# Patient Record
Sex: Male | Born: 1985 | Race: White | Hispanic: No | Marital: Single
Health system: Midwestern US, Community
[De-identification: ages and names within clinical notes are randomized; demographics above are authoritative.]

## PROBLEM LIST (undated history)

## (undated) DIAGNOSIS — N200 Calculus of kidney: Secondary | ICD-10-CM

## (undated) DIAGNOSIS — N19 Unspecified kidney failure: Secondary | ICD-10-CM

---

## 2011-02-18 ENCOUNTER — Emergency Department (HOSPITAL_COMMUNITY)
Admission: EM | Admit: 2011-02-18 | Discharge: 2011-02-18 | Disposition: A | Discharge status: Home / Self Care | Payer: No Typology Code available for payment source | Attending: Emergency Medicine | Admitting: Emergency Medicine

## 2011-02-18 ENCOUNTER — Emergency Department (HOSPITAL_COMMUNITY): Payer: No Typology Code available for payment source

## 2011-02-18 DIAGNOSIS — M542 Cervicalgia: Secondary | ICD-10-CM | POA: Insufficient documentation

## 2011-02-18 DIAGNOSIS — S139XXA Sprain of joints and ligaments of unspecified parts of neck, initial encounter: Secondary | ICD-10-CM | POA: Insufficient documentation

## 2014-04-17 ENCOUNTER — Emergency Department (HOSPITAL_COMMUNITY)
Admission: EM | Admit: 2014-04-17 | Discharge: 2014-04-17 | Disposition: A | Discharge status: Home / Self Care | Payer: BC Managed Care – PPO | Attending: Emergency Medicine | Admitting: Emergency Medicine

## 2014-04-17 ENCOUNTER — Encounter (HOSPITAL_COMMUNITY): Payer: Self-pay | Admitting: Emergency Medicine

## 2014-04-17 ENCOUNTER — Emergency Department (HOSPITAL_COMMUNITY): Payer: BC Managed Care – PPO

## 2014-04-17 DIAGNOSIS — F17200 Nicotine dependence, unspecified, uncomplicated: Secondary | ICD-10-CM | POA: Insufficient documentation

## 2014-04-17 DIAGNOSIS — Z79899 Other long term (current) drug therapy: Secondary | ICD-10-CM | POA: Insufficient documentation

## 2014-04-17 DIAGNOSIS — Z87442 Personal history of urinary calculi: Secondary | ICD-10-CM | POA: Insufficient documentation

## 2014-04-17 DIAGNOSIS — N23 Unspecified renal colic: Secondary | ICD-10-CM

## 2014-04-17 HISTORY — DX: Calculus of kidney: N20.0

## 2014-04-17 HISTORY — DX: Unspecified kidney failure: N19

## 2014-04-17 LAB — CBC WITH DIFFERENTIAL/PLATELET
BASOS PCT: 1 % (ref 0–1)
Basophils Absolute: 0 10*3/uL (ref 0.0–0.1)
EOS ABS: 0.1 10*3/uL (ref 0.0–0.7)
EOS PCT: 1 % (ref 0–5)
HEMATOCRIT: 44 % (ref 39.0–52.0)
HEMOGLOBIN: 15.6 g/dL (ref 13.0–17.0)
LYMPHS ABS: 3 10*3/uL (ref 0.7–4.0)
Lymphocytes Relative: 41 % (ref 12–46)
MCH: 31.5 pg (ref 26.0–34.0)
MCHC: 35.5 g/dL (ref 30.0–36.0)
MCV: 88.7 fL (ref 78.0–100.0)
MONO ABS: 0.7 10*3/uL (ref 0.1–1.0)
MONOS PCT: 9 % (ref 3–12)
Neutro Abs: 3.4 10*3/uL (ref 1.7–7.7)
Neutrophils Relative %: 48 % (ref 43–77)
Platelets: 283 10*3/uL (ref 150–400)
RBC: 4.96 MIL/uL (ref 4.22–5.81)
RDW: 11.8 % (ref 11.5–15.5)
WBC: 7.2 10*3/uL (ref 4.0–10.5)

## 2014-04-17 LAB — COMPREHENSIVE METABOLIC PANEL
ALBUMIN: 4.1 g/dL (ref 3.5–5.2)
ALK PHOS: 59 U/L (ref 39–117)
ALT: 16 U/L (ref 0–53)
AST: 16 U/L (ref 0–37)
BILIRUBIN TOTAL: 1.2 mg/dL (ref 0.3–1.2)
BUN: 12 mg/dL (ref 6–23)
CHLORIDE: 105 meq/L (ref 96–112)
CO2: 22 mEq/L (ref 19–32)
Calcium: 9 mg/dL (ref 8.4–10.5)
Creatinine, Ser: 1.07 mg/dL (ref 0.50–1.35)
GFR calc Af Amer: >90 mL/min (ref >90)
GFR calc non Af Amer: >90 mL/min (ref >90)
Glucose, Bld: 109 mg/dL — ABNORMAL HIGH (ref 70–99)
POTASSIUM: 3.8 meq/L (ref 3.7–5.3)
Sodium: 143 mEq/L (ref 137–147)
Total Protein: 6.7 g/dL (ref 6.0–8.3)

## 2014-04-17 LAB — URINALYSIS, ROUTINE W REFLEX MICROSCOPIC
GLUCOSE, UA: NEGATIVE mg/dL
KETONES UR: NEGATIVE mg/dL
NITRITE: NEGATIVE
PH: 5.5 (ref 5.0–8.0)
Protein, ur: 100 mg/dL — AB
Specific Gravity, Urine: 1.029 (ref 1.005–1.030)
Urobilinogen, UA: 1 mg/dL (ref 0.0–1.0)

## 2014-04-17 LAB — URINE MICROSCOPIC-ADD ON

## 2014-04-17 LAB — LIPASE, BLOOD: LIPASE: 19 U/L (ref 11–59)

## 2014-04-17 MED ORDER — HYDROMORPHONE HCL PF 1 MG/ML IJ SOLN
1.0000 mg | Freq: Once | INTRAMUSCULAR | Status: AC
  Administered 2014-04-17: 1 mg via INTRAVENOUS
  Filled 2014-04-17: qty 1

## 2014-04-17 MED ORDER — HYDROMORPHONE HCL 4 MG PO TABS: 4.0000 mg | ORAL_TABLET | ORAL | Status: DC | PRN

## 2014-04-17 MED ORDER — METOCLOPRAMIDE HCL 10 MG PO TABS: 10.0000 mg | ORAL_TABLET | Freq: Four times a day (QID) | ORAL | Status: AC | PRN

## 2014-04-17 MED ORDER — ONDANSETRON 8 MG PO TBDP: ORAL_TABLET | ORAL | Status: DC

## 2014-04-17 MED ORDER — HYDROMORPHONE HCL PF 1 MG/ML IJ SOLN: 1.0000 mg | Freq: Once | INTRAMUSCULAR | Status: DC

## 2014-04-17 MED ORDER — CEPHALEXIN 500 MG PO CAPS: ORAL_CAPSULE | ORAL | Status: DC

## 2014-04-17 MED ORDER — METOCLOPRAMIDE HCL 5 MG/ML IJ SOLN
10.0000 mg | Freq: Once | INTRAMUSCULAR | Status: AC
  Administered 2014-04-17: 10 mg via INTRAVENOUS
  Filled 2014-04-17: qty 2

## 2014-04-17 NOTE — Progress Notes (Signed)
[facility] provided pt with a list of primary care resources to help patient establish a pcp.  °

## 2014-04-17 NOTE — ED Notes (Signed)
Patient transported to Ultrasound 

## 2014-04-17 NOTE — ED Notes (Signed)
Rn went to d/c patient, pt requesting more pain medication before leaving. MD made aware and 1mg  of Dilaudid given. Will wait 15-20 minutes before discahrging pt. After medication given.

## 2014-04-17 NOTE — ED Notes (Signed)
Pt complains of L flank pain that began this am. Pt states it feels like kidney stone pain/pain from kidney failure. Pt was treated for kidney failur in September, but recovered. EMS gave 30mg  tordol, 4mg  zofran and 400ml NS. Pt states pain has decreased to 6/10 from 10/10.

## 2014-04-17 NOTE — ED Provider Notes (Signed)
CSN: [id number]     Arrival date & time 04/17/14  1105 History   First MD Initiated Contact with Patient 04/17/14 1113     Chief Complaint  Patient presents with  . Flank Pain     (Consider location/radiation/quality/duration/timing/severity/associated sxs/prior Treatment) HPI 28 year old male with history kidney stones also history of kidney failure on related to stones last CT scan within the last year and other healthcare system states his typical sudden severe colicky left flank pain with multiple episodes of nonbloody vomiting this morning with pain and vomiting not controlled with Toradol and Zofran from EMS. He is no fever no confusion no chest pain no shortness of breath no right-sided abdominal pain no other concerns. His left flank pain radiates to his left side of his abdomen. Past Medical History  Diagnosis Date  . Kidney stone   . Kidney failure    History reviewed. No pertinent past surgical history. History reviewed. No pertinent family history. History  Substance Use Topics  . Smoking status: Current Some Day Smoker  . Smokeless tobacco: Not on file  . Alcohol Use: Yes    Review of Systems 10 Systems reviewed and are negative for acute change except as noted in the HPI.   Allergies  Review of patient's allergies indicates no known allergies.  Home Medications   Prior to Admission medications   Medication Sig Start Date End Date Taking? Authorizing Provider  loratadine (CLARITIN) 10 MG tablet Take 10 mg by mouth daily.   Yes Historical Provider, MD  cephALEXin (KEFLEX) 500 MG capsule 2 caps po bid x 7 days 04/17/14   Hurman HornJohn M Bednar, MD  HYDROmorphone (DILAUDID) 4 MG tablet Take 1 tablet (4 mg total) by mouth every 4 (four) hours as needed for severe pain. 04/17/14   Hurman HornJohn M Bednar, MD  metoCLOPramide (REGLAN) 10 MG tablet Take 1 tablet (10 mg total) by mouth every 6 (six) hours as needed for nausea (nausea/headache). 04/17/14   Hurman HornJohn M Bednar, MD  ondansetron (ZOFRAN  ODT) 8 MG disintegrating tablet 8mg  ODT q4 hours prn nausea 04/17/14   Hurman HornJohn M Bednar, MD   BP 115/64  Pulse 62  Temp(Src) 97.6 F (36.4 C) (Oral)  Resp 16  SpO2 96% Physical Exam  Nursing note and vitals reviewed. Constitutional:  Awake, alert, nontoxic appearance.  HENT:  Head: Atraumatic.  Eyes: Right eye exhibits no discharge. Left eye exhibits no discharge.  Neck: Neck supple.  Cardiovascular: Normal rate and regular rhythm.   No murmur heard. Pulmonary/Chest: Effort normal and breath sounds normal. No respiratory distress. He has no wheezes. He has no rales. He exhibits no tenderness.  Abdominal: Soft. Bowel sounds are normal. He exhibits no distension and no mass. There is tenderness. There is no rebound and no guarding.  Mild left-sided abdominal tenderness  Genitourinary:  Positive left CVA tenderness; negative for right CVA tenderness  Musculoskeletal: He exhibits no tenderness.  Baseline ROM, no obvious new focal weakness.  Neurological: He is alert.  Mental status and motor strength appears baseline for patient and situation.  Skin: No rash noted.  Psychiatric: He has a normal mood and affect.    ED Course  Procedures (including critical care time) Pt feels improved after observation and/or treatment in ED.Patient / Family / Caregiver informed of clinical course, understand medical decision-making process, and agree with plan. Labs Review Labs Reviewed  COMPREHENSIVE METABOLIC PANEL - Abnormal; Notable for the following:    Glucose, Bld 109 (*)    All other  components within normal limits  URINALYSIS, ROUTINE W REFLEX MICROSCOPIC - Abnormal; Notable for the following:    Color, Urine RED (*)    APPearance TURBID (*)    Hgb urine dipstick LARGE (*)    Bilirubin Urine MODERATE (*)    Protein, ur 100 (*)    Leukocytes, UA SMALL (*)    All other components within normal limits  URINE MICROSCOPIC-ADD ON - Abnormal; Notable for the following:    Bacteria, UA FEW  (*)    Crystals CA OXALATE CRYSTALS (*)    All other components within normal limits  URINE CULTURE  LIPASE, BLOOD  CBC WITH DIFFERENTIAL    Imaging Review No results found. [facility]  04/17/2014   CLINICAL DATA:  Flank pain.  EXAM: RENAL/URINARY TRACT ULTRASOUND COMPLETE  COMPARISON:  None.  FINDINGS: Right Kidney:  Length: 10.5 cm. Echogenicity within normal limits. No mass or hydronephrosis visualized. There may be a 0.5 cm in diameter nonobstructing stone in the upper pole.  Left Kidney:  Length: 11.5 cm. Echogenicity within normal limits. No mass or hydronephrosis visualized. There appears to be a 1.5 cm nonobstructing stone lower pole.  Bladder:  Appears normal for degree of bladder distention. The prostate gland appears prominent with a calcification identified.  IMPRESSION: Negative for hydronephrosis or other acute abnormality.  Possible nonobstructing bilateral renal stones, larger on the left.   Electronically Signed   By: Drusilla Kannerhomas  Dalessio M.D.   On: 04/17/2014 12:33   EKG Interpretation None      MDM   Final diagnoses:  Renal colic on left side    I doubt any other EMC precluding discharge at this time including, but not necessarily limited to the following:ARF, sepsis.    Hurman HornJohn M Bednar, MD 04/19/14 2153

## 2014-04-17 NOTE — ED Notes (Signed)
MD at bedside. 

## 2014-04-17 NOTE — ED Notes (Signed)
Pt returned from US

## 2014-04-17 NOTE — ED Notes (Signed)
Pt. Is unable to use the restroom at this time, but is aware that we need a urine specimen.  

## 2014-04-17 NOTE — Discharge Instructions (Signed)

## 2014-04-17 NOTE — ED Notes (Signed)
Bed: WA17 Expected date:  Expected time:  Means of arrival:  Comments: ems- flank pain 

## 2014-04-18 LAB — URINE CULTURE
CULTURE: NO GROWTH
Colony Count: NO GROWTH

## 2014-05-03 ENCOUNTER — Encounter (HOSPITAL_COMMUNITY): Payer: Self-pay | Admitting: Emergency Medicine

## 2014-05-03 ENCOUNTER — Emergency Department (HOSPITAL_COMMUNITY)
Admission: EM | Admit: 2014-05-03 | Discharge: 2014-05-03 | Disposition: A | Discharge status: Home / Self Care | Payer: No Typology Code available for payment source | Attending: Emergency Medicine | Admitting: Emergency Medicine

## 2014-05-03 DIAGNOSIS — N19 Unspecified kidney failure: Secondary | ICD-10-CM | POA: Insufficient documentation

## 2014-05-03 DIAGNOSIS — Z880 Allergy status to penicillin: Secondary | ICD-10-CM | POA: Insufficient documentation

## 2014-05-03 DIAGNOSIS — F17200 Nicotine dependence, unspecified, uncomplicated: Secondary | ICD-10-CM | POA: Insufficient documentation

## 2014-05-03 DIAGNOSIS — N23 Unspecified renal colic: Secondary | ICD-10-CM

## 2014-05-03 DIAGNOSIS — Z79899 Other long term (current) drug therapy: Secondary | ICD-10-CM | POA: Insufficient documentation

## 2014-05-03 LAB — URINE MICROSCOPIC-ADD ON

## 2014-05-03 LAB — CBC WITH DIFFERENTIAL/PLATELET
Basophils Absolute: 0 10*3/uL (ref 0.0–0.1)
Basophils Relative: 0 % (ref 0–1)
EOS ABS: 0 10*3/uL (ref 0.0–0.7)
EOS PCT: 0 % (ref 0–5)
HEMATOCRIT: 46.2 % (ref 39.0–52.0)
Hemoglobin: 16.5 g/dL (ref 13.0–17.0)
LYMPHS ABS: 2.2 10*3/uL (ref 0.7–4.0)
Lymphocytes Relative: 19 % (ref 12–46)
MCH: 31.7 pg (ref 26.0–34.0)
MCHC: 35.7 g/dL (ref 30.0–36.0)
MCV: 88.8 fL (ref 78.0–100.0)
MONO ABS: 0.9 10*3/uL (ref 0.1–1.0)
Monocytes Relative: 8 % (ref 3–12)
Neutro Abs: 8.5 10*3/uL — ABNORMAL HIGH (ref 1.7–7.7)
Neutrophils Relative %: 73 % (ref 43–77)
PLATELETS: 315 10*3/uL (ref 150–400)
RBC: 5.2 MIL/uL (ref 4.22–5.81)
RDW: 11.6 % (ref 11.5–15.5)
WBC: 11.6 10*3/uL — ABNORMAL HIGH (ref 4.0–10.5)

## 2014-05-03 LAB — BASIC METABOLIC PANEL
Anion gap: 17 — ABNORMAL HIGH (ref 5–15)
BUN: 16 mg/dL (ref 6–23)
CALCIUM: 10.2 mg/dL (ref 8.4–10.5)
CO2: 22 meq/L (ref 19–32)
CREATININE: 1.1 mg/dL (ref 0.50–1.35)
Chloride: 102 mEq/L (ref 96–112)
GFR calc Af Amer: >90 mL/min (ref >90)
GLUCOSE: 117 mg/dL — AB (ref 70–99)
Potassium: 3.7 mEq/L (ref 3.7–5.3)
SODIUM: 141 meq/L (ref 137–147)

## 2014-05-03 LAB — URINALYSIS, ROUTINE W REFLEX MICROSCOPIC
Bilirubin Urine: NEGATIVE
Glucose, UA: NEGATIVE mg/dL
Ketones, ur: NEGATIVE mg/dL
LEUKOCYTES UA: NEGATIVE
NITRITE: NEGATIVE
PROTEIN: NEGATIVE mg/dL
SPECIFIC GRAVITY, URINE: 1.02 (ref 1.005–1.030)
UROBILINOGEN UA: 0.2 mg/dL (ref 0.0–1.0)
pH: 5.5 (ref 5.0–8.0)

## 2014-05-03 MED ORDER — ONDANSETRON HCL 4 MG/2ML IJ SOLN
4.0000 mg | Freq: Once | INTRAMUSCULAR | Status: AC
  Administered 2014-05-03: 4 mg via INTRAVENOUS
  Filled 2014-05-03: qty 2

## 2014-05-03 MED ORDER — HYDROMORPHONE HCL PF 1 MG/ML IJ SOLN
2.0000 mg | Freq: Once | INTRAMUSCULAR | Status: AC
  Administered 2014-05-03: 2 mg via INTRAVENOUS
  Filled 2014-05-03: qty 2

## 2014-05-03 MED ORDER — ONDANSETRON 8 MG PO TBDP: ORAL_TABLET | ORAL | Status: AC

## 2014-05-03 MED ORDER — SODIUM CHLORIDE 0.9 % IV SOLN
INTRAVENOUS | Status: DC
  Administered 2014-05-03: 12:00:00 via INTRAVENOUS

## 2014-05-03 NOTE — ED Notes (Signed)
MD at bedside. 

## 2014-05-03 NOTE — ED Provider Notes (Addendum)
CSN: [id number]     Arrival date & time 05/03/14  1036 History   First MD Initiated Contact with Patient 05/03/14 1054     Chief Complaint  Patient presents with  . Nephrolithiasis    left     (Consider location/radiation/quality/duration/timing/severity/associated sxs/prior Treatment) The history is provided by the patient.   patient here complaining of worsening renal colic. Seen by urologist last week for a history of multiple stones. Conservative therapy is currently being used. He continues to note increasing left flank pain with urinary urgency. Has been vomiting at home and cannot keep down his oral hydromorphone tablets. No fever or chills. Symptoms are made better with nothing and he can't stop is comfortable. Denies any testicular swelling. No penile drainage or discharge. No rashes to his flank.  Past Medical History  Diagnosis Date  . Kidney stone   . Kidney failure    History reviewed. No pertinent past surgical history. No family history on file. History  Substance Use Topics  . Smoking status: Current Some Day Smoker  . Smokeless tobacco: Not on file  . Alcohol Use: Yes    Review of Systems  All other systems reviewed and are negative.     Allergies  Penicillins  Home Medications   Prior to Admission medications   Medication Sig Start Date End Date Taking? Authorizing Provider  HYDROmorphone (DILAUDID) 2 MG tablet Take 4 mg by mouth every 4 (four) hours as needed for severe pain.   Yes Historical Provider, MD  metoCLOPramide (REGLAN) 10 MG tablet Take 1 tablet (10 mg total) by mouth every 6 (six) hours as needed for nausea (nausea/headache). 04/17/14   Hurman HornJohn M Bednar, MD  ondansetron (ZOFRAN ODT) 8 MG disintegrating tablet 8mg  ODT q4 hours prn nausea 04/17/14   Hurman HornJohn M Bednar, MD   BP 122/79  Pulse 73  Resp 20  SpO2 99% Physical Exam  Nursing note and vitals reviewed. Constitutional: He is oriented to person, place, and time. He appears well-developed and  well-nourished.  Non-toxic appearance. No distress.  HENT:  Head: Normocephalic and atraumatic.  Eyes: Conjunctivae, EOM and lids are normal. Pupils are equal, round, and reactive to light.  Neck: Normal range of motion. Neck supple. No tracheal deviation present. No mass present.  Cardiovascular: Normal rate, regular rhythm and normal heart sounds.  Exam reveals no gallop.   No murmur heard. Pulmonary/Chest: Effort normal and breath sounds normal. No stridor. No respiratory distress. He has no decreased breath sounds. He has no wheezes. He has no rhonchi. He has no rales.  Abdominal: Soft. Normal appearance and bowel sounds are normal. He exhibits no distension. There is tenderness in the left lower quadrant. There is CVA tenderness. There is no rigidity, no rebound and no guarding.    Musculoskeletal: Normal range of motion. He exhibits no edema and no tenderness.  Neurological: He is alert and oriented to person, place, and time. He has normal strength. No cranial nerve deficit or sensory deficit. GCS eye subscore is 4. GCS verbal subscore is 5. GCS motor subscore is 6.  Skin: Skin is warm and dry. No abrasion and no rash noted.  Psychiatric: He has a normal mood and affect. His speech is normal and behavior is normal.    ED Course  Procedures (including critical care time) Labs Review Labs Reviewed  URINE CULTURE  CBC WITH DIFFERENTIAL  BASIC METABOLIC PANEL  URINALYSIS, ROUTINE W REFLEX MICROSCOPIC    Imaging Review No results found.   EKG  Interpretation None      MDM   Final diagnoses:  None    Patient has had his pain controlled here. Spoke with his urologist who agrees to discharge and will see him in followup. Recommends no CT at this time    Toy Baker, MD 05/03/14 1340  Toy Baker, MD 05/03/14 [phone]

## 2014-05-03 NOTE — ED Notes (Signed)
Pt states was seen last week for kidney stones, saw the urologist and was waiting to see if would pass, states stones on L side, states still in severe pain and having n/v, pt actively vomiting in triage, states took 2mg  PO dilaudid at home w/ no relief.

## 2014-05-03 NOTE — Discharge Instructions (Signed)

## 2014-05-04 LAB — URINE CULTURE
Colony Count: NO GROWTH
Culture: NO GROWTH

## 2015-11-26 IMAGING — US US RENAL
1 series · 14 of 25 positions shown · non-contrast
Comparison: None.

CLINICAL DATA: Flank pain.

EXAM:
RENAL/URINARY TRACT ULTRASOUND COMPLETE

[Series 1: us renal · 0.20mm/px · 14 of 55 slices shown]
[im 1/55]
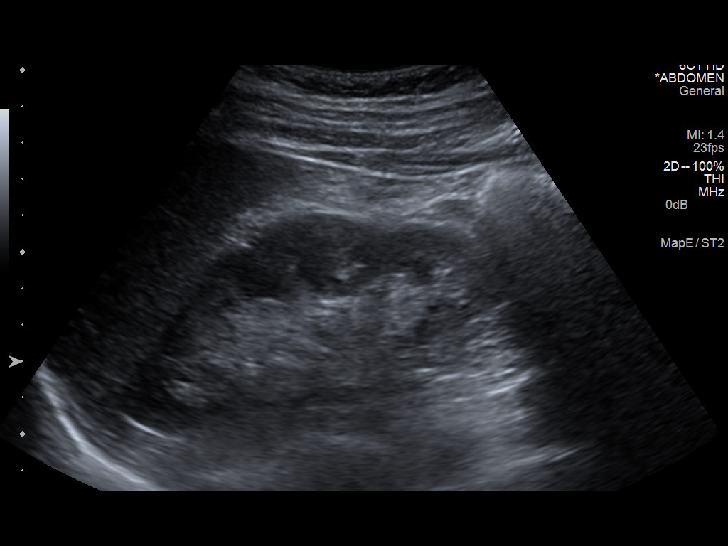
[im 5/55]
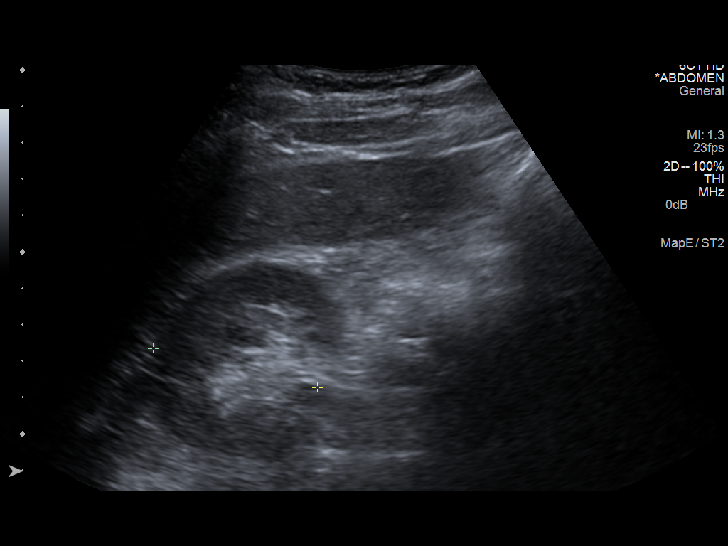
[im 10/55]
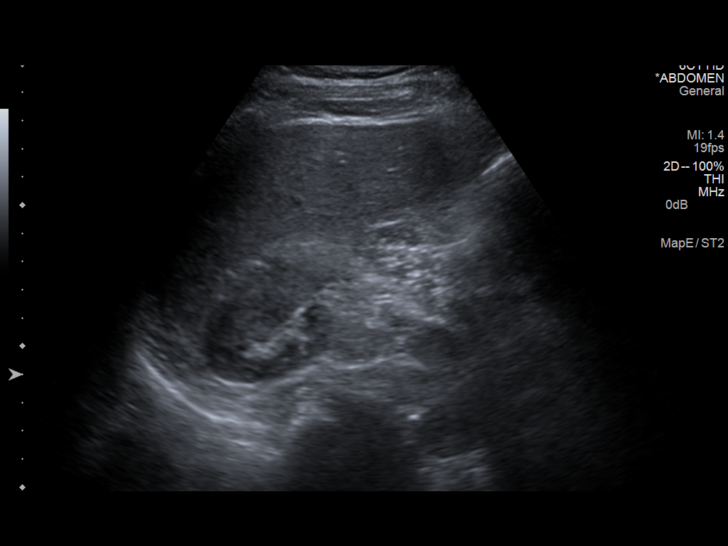
[im 14/55]
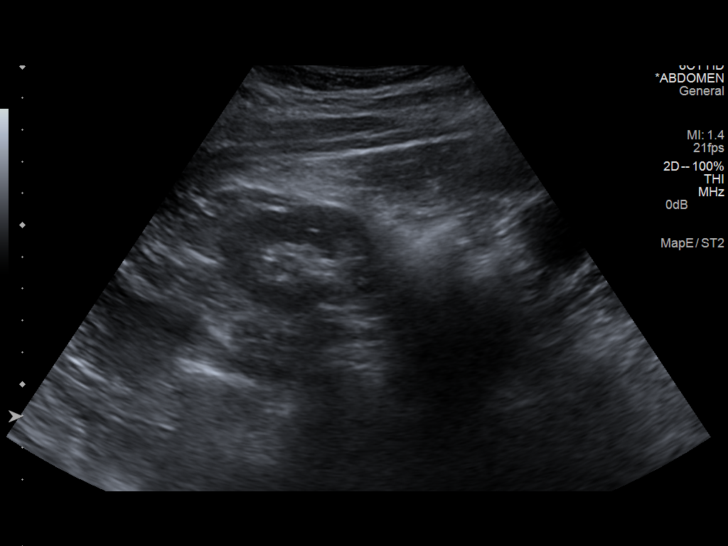
[im 19/55]
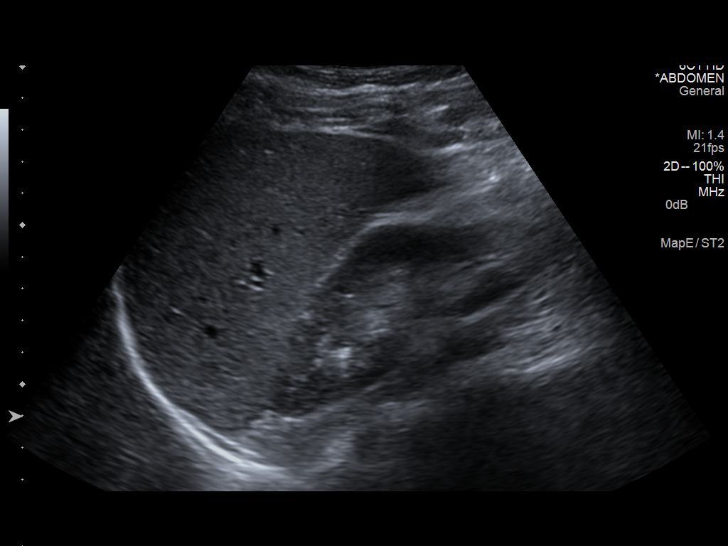
[im 21/55]
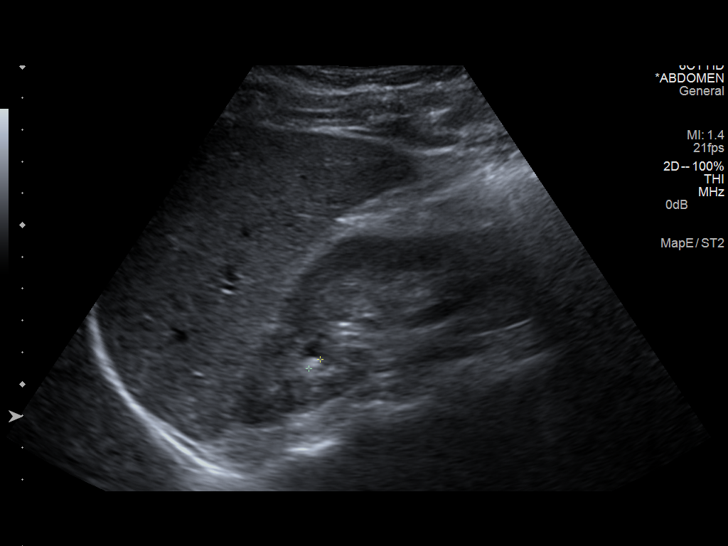
[im 25/55]
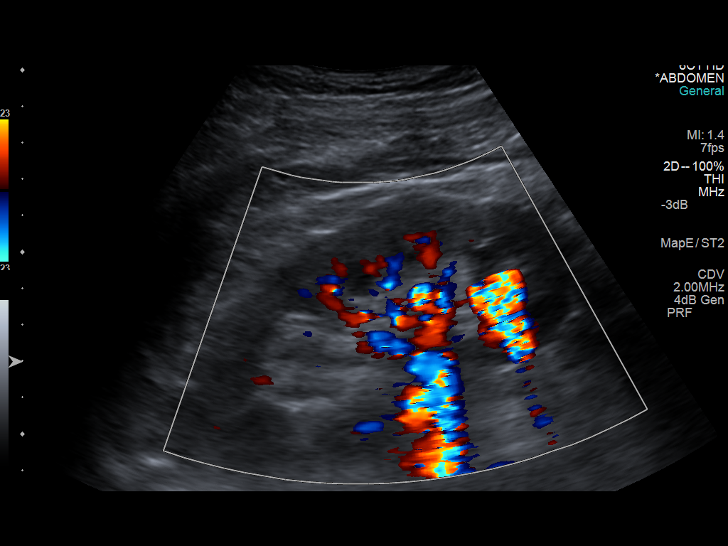
[im 30/55]
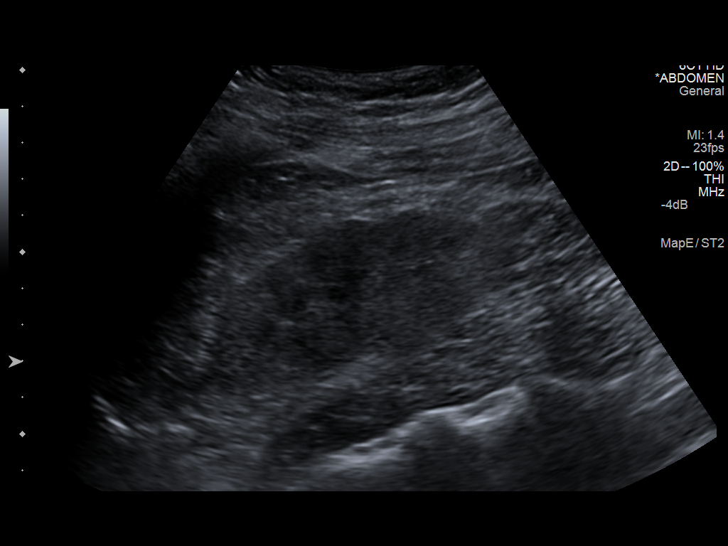
[im 34/55]
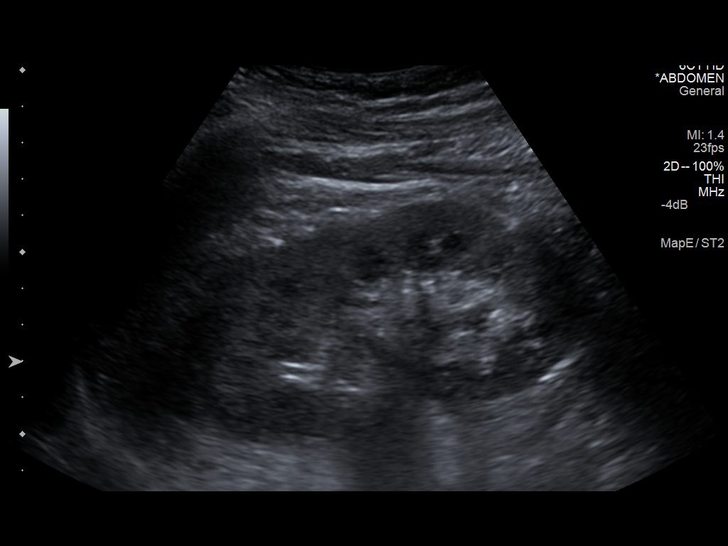
[im 37/55]
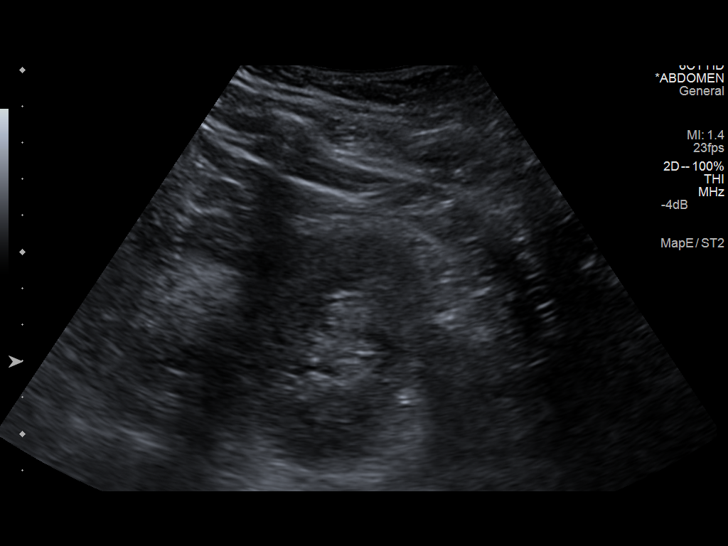
[im 41/55]
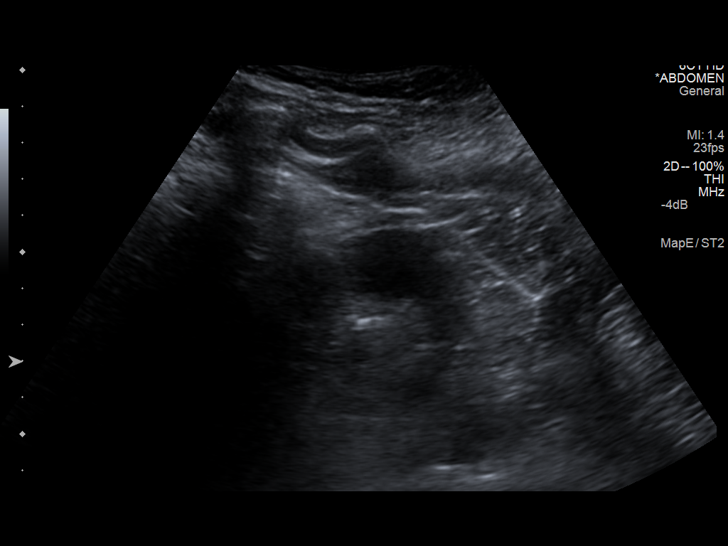
[im 46/55]
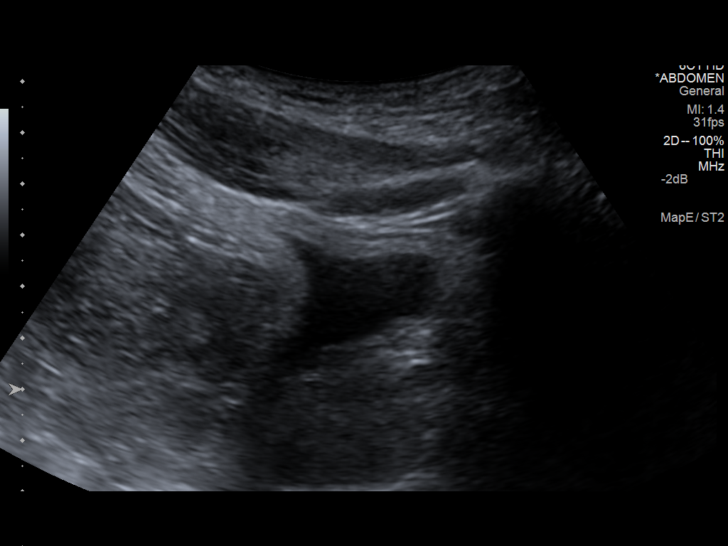
[im 50/55]
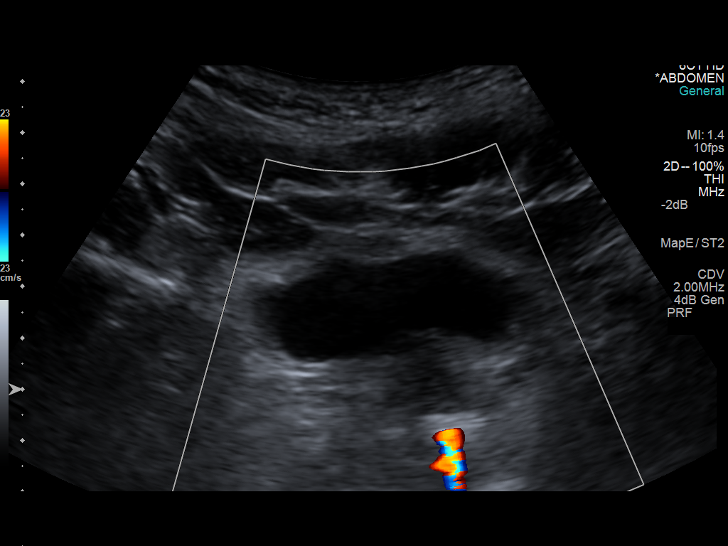
[im 55/55]
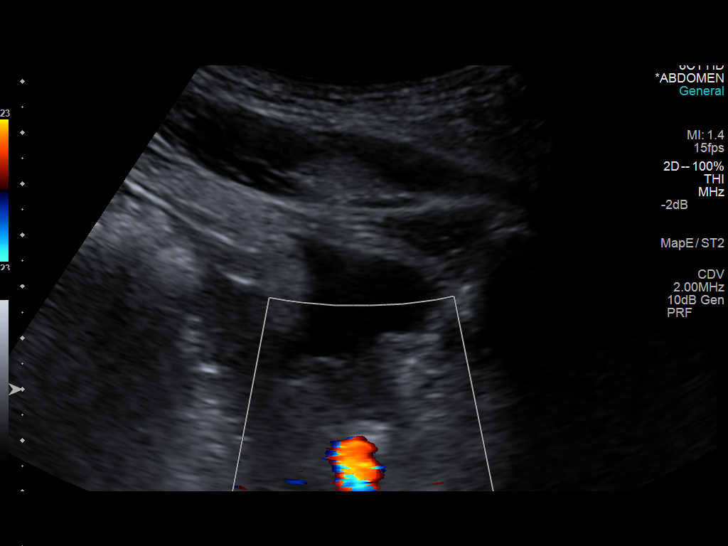

[14 of 25 positions shown; findings below may reference images not displayed]

FINDINGS: Right Kidney:

Length: 10.5 cm. Echogenicity within normal limits. No mass or
hydronephrosis visualized. There may be a 0.5 cm in diameter
nonobstructing stone in the upper pole.

Left Kidney:

Length: 11.5 cm. Echogenicity within normal limits. No mass or
hydronephrosis visualized. There appears to be a 1.5 cm
nonobstructing stone lower pole.

Bladder:

Appears normal for degree of bladder distention. The prostate gland
appears prominent with a calcification identified.
IMPRESSION: Negative for hydronephrosis or other acute abnormality.

Possible nonobstructing bilateral renal stones, larger on the left.

## 2017-09-22 NOTE — Progress Notes (Signed)
Formatting of this note is different from the original.  Subjective     Patient ID: Roy Garcia is a 31 y.o. male.    Urinary Tract Infection    Chronicity: Pts gf has UTI from Ureaplasma,pt has no symptoms. The patient is experiencing no pain. There has been no fever. Pertinent negatives include no chills, discharge, flank pain, frequency, hematuria, nausea, urgency or vomiting. He has tried nothing for the symptoms.     The following portions of the patient's history were reviewed and updated as appropriate: allergies, current medications, past family history, past medical history, past social history, past surgical history, problem list.    Vitals:    09/22/17 1856   BP: 116/78   Pulse: 78   Resp: 16   Temp: 98.6 F (37 C)   TempSrc: Oral   SpO2: 99%     Review of Systems   Constitutional: Negative for chills and fever.   HENT: Negative for congestion, ear pain, rhinorrhea, sinus pressure and sore throat.    Respiratory: Negative for cough and shortness of breath.    Cardiovascular: Negative for chest pain.   Gastrointestinal: Negative for abdominal pain, diarrhea, nausea and vomiting.   Genitourinary: Negative for difficulty urinating, discharge, dysuria, flank pain, frequency, hematuria and urgency.   Musculoskeletal: Negative for myalgias.   Skin: Negative for rash.   All other systems reviewed and are negative.    Objective     Physical Exam   Constitutional: He is oriented to person, place, and time. He appears well-developed and well-nourished.   Cardiovascular: Normal rate, regular rhythm and normal heart sounds.   Pulmonary/Chest: Effort normal and breath sounds normal.   Abdominal: Soft. Bowel sounds are normal. There is no tenderness.   Neurological: He is alert and oriented to person, place, and time.   Psychiatric: He has a normal mood and affect.   Nursing note and vitals reviewed.    Procedures    Assessment/Plan     Diagnoses and all orders for this visit:    Urinary tract infection without  hematuria, site unspecified    Roy Garcia discharge to Home    Condition at discharge: Good  Electronically signed by Albertha Ghee, MD at 09/22/2017  7:21 PM EST

## 2017-09-22 NOTE — Addendum Note (Signed)
Addended by: Jennet Maduro on: 09/22/2017 07:33 PM     Modules accepted: Orders      Electronically signed by Jennet Maduro, MA at 09/22/2017  7:33 PM EST

## 2023-01-07 ENCOUNTER — Inpatient Hospital Stay
Admit: 2023-01-07 | Discharge: 2023-01-07 | Disposition: A | Discharge status: Home / Self Care | Payer: PRIVATE HEALTH INSURANCE | Attending: Student in an Organized Health Care Education/Training Program

## 2023-01-07 ENCOUNTER — Emergency Department: Admit: 2023-01-07 | Payer: PRIVATE HEALTH INSURANCE

## 2023-01-07 DIAGNOSIS — N132 Hydronephrosis with renal and ureteral calculous obstruction: Secondary | ICD-10-CM

## 2023-01-07 DIAGNOSIS — N200 Calculus of kidney: Secondary | ICD-10-CM

## 2023-01-07 LAB — CBC WITH AUTO DIFFERENTIAL
Absolute Immature Granulocyte: 0 10*3/uL (ref 0.00–0.04)
Basophils %: 1 % (ref 0–1)
Basophils Absolute: 0.1 10*3/uL (ref 0.0–0.1)
Eosinophils %: 1 % (ref 0–7)
Eosinophils Absolute: 0 10*3/uL (ref 0.0–0.4)
Hematocrit: 48 % (ref 36.6–50.3)
Hemoglobin: 16.6 g/dL (ref 12.1–17.0)
Immature Granulocytes: 0 % (ref 0.0–0.5)
Lymphocytes %: 35 % (ref 12–49)
Lymphocytes Absolute: 1.9 10*3/uL (ref 0.8–3.5)
MCH: 31.1 PG (ref 26.0–34.0)
MCHC: 34.6 g/dL (ref 30.0–36.5)
MCV: 89.9 FL (ref 80.0–99.0)
MPV: 8.6 FL — ABNORMAL LOW (ref 8.9–12.9)
Monocytes %: 10 % (ref 5–13)
Monocytes Absolute: 0.6 10*3/uL (ref 0.0–1.0)
Neutrophils %: 53 % (ref 32–75)
Neutrophils Absolute: 2.9 10*3/uL (ref 1.8–8.0)
Nucleated RBCs: 0 PER 100 WBC
Platelets: 292 10*3/uL (ref 150–400)
RBC: 5.34 M/uL (ref 4.10–5.70)
RDW: 11.8 % (ref 11.5–14.5)
WBC: 5.4 10*3/uL (ref 4.1–11.1)
nRBC: 0 10*3/uL (ref 0.00–0.01)

## 2023-01-07 LAB — URINALYSIS WITH REFLEX TO CULTURE
BACTERIA, URINE: NEGATIVE /hpf
Bilirubin Urine: NEGATIVE
Glucose, UA: NEGATIVE mg/dL
Ketones, Urine: NEGATIVE mg/dL
Leukocyte Esterase, Urine: NEGATIVE
Nitrite, Urine: NEGATIVE
Specific Gravity, UA: 1.022 (ref 1.003–1.030)
Urobilinogen, Urine: 0.2 EU/dL (ref 0.2–1.0)
pH, Urine: 6 (ref 5.0–8.0)

## 2023-01-07 LAB — EXTRA TUBES HOLD

## 2023-01-07 LAB — COMPREHENSIVE METABOLIC PANEL
ALT: 49 U/L (ref 12–78)
AST: 24 U/L (ref 15–37)
Albumin/Globulin Ratio: 1.2 (ref 1.1–2.2)
Albumin: 4.1 g/dL (ref 3.5–5.0)
Alk Phosphatase: 66 U/L (ref 45–117)
Anion Gap: 4 mmol/L — ABNORMAL LOW (ref 5–15)
BUN: 13 MG/DL (ref 6–20)
Bun/Cre Ratio: 10 — ABNORMAL LOW (ref 12–20)
CO2: 27 mmol/L (ref 21–32)
Calcium: 9.5 MG/DL (ref 8.5–10.1)
Chloride: 109 mmol/L — ABNORMAL HIGH (ref 97–108)
Creatinine: 1.29 MG/DL (ref 0.70–1.30)
Est, Glom Filt Rate: >60 mL/min/{1.73_m2} (ref >60)
Globulin: 3.5 g/dL (ref 2.0–4.0)
Glucose: 107 mg/dL — ABNORMAL HIGH (ref 65–100)
Potassium: 4.2 mmol/L (ref 3.5–5.1)
Sodium: 140 mmol/L (ref 136–145)
Total Bilirubin: 0.5 MG/DL (ref 0.2–1.0)
Total Protein: 7.6 g/dL (ref 6.4–8.2)

## 2023-01-07 LAB — MAGNESIUM: Magnesium: 1.9 mg/dL (ref 1.6–2.4)

## 2023-01-07 LAB — LIPASE: Lipase: 36 U/L (ref 13–75)

## 2023-01-07 MED ORDER — KETOROLAC TROMETHAMINE 30 MG/ML IJ SOLN
30 | Freq: Once | INTRAMUSCULAR | Status: AC
Start: 2023-01-07 — End: 2023-01-07
  Administered 2023-01-07: 14:00:00 15 mg via INTRAVENOUS

## 2023-01-07 MED ORDER — TAMSULOSIN HCL 0.4 MG PO CAPS
0.4 | ORAL_CAPSULE | Freq: Every day | ORAL | 0 refills | Status: AC
Start: 2023-01-07 — End: ?

## 2023-01-07 MED ORDER — ONDANSETRON HCL 4 MG/2ML IJ SOLN
4 | Freq: Once | INTRAMUSCULAR | Status: AC
Start: 2023-01-07 — End: 2023-01-07
  Administered 2023-01-07: 13:00:00 4 mg via INTRAVENOUS

## 2023-01-07 MED ORDER — KETOROLAC TROMETHAMINE 30 MG/ML IJ SOLN
30 | Freq: Once | INTRAMUSCULAR | Status: AC
Start: 2023-01-07 — End: 2023-01-07
  Administered 2023-01-07: 13:00:00 15 mg via INTRAVENOUS

## 2023-01-07 MED ORDER — SODIUM CHLORIDE 0.9 % IV BOLUS
0.9 | Freq: Once | INTRAVENOUS | Status: AC
Start: 2023-01-07 — End: 2023-01-07
  Administered 2023-01-07: 13:00:00 1000 mL via INTRAVENOUS

## 2023-01-07 MED ORDER — OXYCODONE-ACETAMINOPHEN 5-325 MG PO TABS
5-325 | ORAL_TABLET | Freq: Four times a day (QID) | ORAL | 0 refills | Status: AC | PRN
Start: 2023-01-07 — End: 2023-01-10

## 2023-01-07 MED ORDER — OXYCODONE-ACETAMINOPHEN 5-325 MG PO TABS
5-325 | Freq: Once | ORAL | Status: AC
Start: 2023-01-07 — End: 2023-01-07
  Administered 2023-01-07: 16:00:00 1 via ORAL

## 2023-01-07 MED ORDER — ONDANSETRON 4 MG PO TBDP
4 | ORAL_TABLET | Freq: Three times a day (TID) | ORAL | 0 refills | Status: AC | PRN
Start: 2023-01-07 — End: 2023-01-10

## 2023-01-07 MED FILL — KETOROLAC TROMETHAMINE 30 MG/ML IJ SOLN: 30 MG/ML | INTRAMUSCULAR | Qty: 1

## 2023-01-07 MED FILL — ONDANSETRON HCL 4 MG/2ML IJ SOLN: 4 MG/2ML | INTRAMUSCULAR | Qty: 2

## 2023-01-07 MED FILL — SODIUM CHLORIDE 0.9 % IV SOLN: 0.9 % | INTRAVENOUS | Qty: 1000

## 2023-01-07 MED FILL — OXYCODONE-ACETAMINOPHEN 5-325 MG PO TABS: 5-325 MG | ORAL | Qty: 1

## 2023-01-07 NOTE — Discharge Instructions (Addendum)
Discussed visit today.  Please call the kidney stone hotline on your way home today.  Please take the medications as needed for pain and supplement with Tylenol and Motrin.  Please take the Zofran as needed for nausea.  Please take the Flomax daily until told otherwise.    Return to the emergency room with any worsening of symptoms.

## 2023-01-07 NOTE — ED Provider Notes (Signed)
[facility] EMERGENCY [place]      Pt Name: Roy Garcia  MRN: [id number]  [place] 1986-08-05  Date of evaluation: 01/07/2023  Provider: Suezanne Jacquet, PA-C    CHIEF COMPLAINT       Chief Complaint   Patient presents with    Flank Pain         HISTORY OF PRESENT ILLNESS    Patient is a 37 year old male with history of kidney stones who presents emergency room with reports of left flank pain, nausea, and some vomiting since about 4 AM this morning.  Patient reports he did take Tylenol this morning without much relief.  Patient reports his last kidney stone was about 6 years ago.  Patient denies any hematuria, but reports it has been difficult urinating.  Patient reports some radiation of pain into the abdomen area. Patient denies chest pain, shortness of breath, diarrhea or constipation, headache, dizziness, lightheadedness, fever or chills.  Patient denies alcohol use, smoking/vaping or illicit drug use.          Nursing Notes were reviewed.    REVIEW OF SYSTEMS       Review of Systems   Genitourinary:  Positive for difficulty urinating and flank pain.   All other systems reviewed and are negative.        PAST MEDICAL HISTORY   No past medical history on file.      SURGICAL HISTORY     No past surgical history on file.      CURRENT MEDICATIONS       Previous Medications    No medications on file       ALLERGIES     Penicillins    FAMILY HISTORY     No family history on file.       SOCIAL HISTORY       Social History     Socioeconomic History    Marital status: Single       PHYSICAL EXAM       Physical Exam  Vitals reviewed.   Constitutional:       General: He is not in acute distress.     Appearance: Normal appearance. He is normal weight. He is not ill-appearing or toxic-appearing.   HENT:      Head: Normocephalic and atraumatic.      Nose: Nose normal.      Mouth/Throat:      Mouth: Mucous membranes are moist.      Pharynx: Oropharynx is clear.   Eyes:      Extraocular Movements: Extraocular  movements intact.      Conjunctiva/sclera: Conjunctivae normal.      Pupils: Pupils are equal, round, and reactive to light.   Cardiovascular:      Rate and Rhythm: Normal rate and regular rhythm.      Pulses: Normal pulses.      Heart sounds: Normal heart sounds.   Pulmonary:      Effort: Pulmonary effort is normal.      Breath sounds: Normal breath sounds.   Abdominal:      General: Abdomen is flat. Bowel sounds are normal.      Palpations: Abdomen is soft.      Tenderness: There is abdominal tenderness (left sided tract of the ureter). There is left CVA tenderness. There is no right CVA tenderness.   Musculoskeletal:         General: Normal range of motion.      Cervical back: Normal range of  motion and neck supple.   Skin:     General: Skin is warm.      Capillary Refill: Capillary refill takes less than 2 seconds.   Neurological:      General: No focal deficit present.      Mental Status: He is alert.   Psychiatric:         Mood and Affect: Mood normal.         Behavior: Behavior normal.           EMERGENCY DEPARTMENT COURSE and DIFFERENTIAL DIAGNOSIS/MDM:   Vitals:    Vitals:    01/07/23 0815   BP: 136/79   Pulse: 82   Resp: 20   Temp: 98.5 F (36.9 C)   TempSrc: Oral   SpO2: 98%     Medical Decision Making  Patient is a 37 year old male with history of kidney stones who presents emergency room with reports of left flank pain, nausea, and some vomiting since about 4 AM this morning. Ddx: Urinary tract infection, kidney stone, dehydration, gastroenteritis, and others.  Physical examination shows unremarkable cardiopulmonary examination.  Patient does have left-sided abdominal tenderness as well as left CVA tenderness.  Toradol, Zofran, and fluids ordered. Urinalysis unremarkable for infection and does show hematuria. CBC unremarkable. CMP shows mild hyperglycemia at 107, but otherwise unremarkable. Lipase and magnesium unremarkable. CT abdomen and pelvis shows 2 calculi in the proximal left ureter measuring 2  mm and 8 mm results in mild left hydronephrosis.  Bilateral nonobstructing nephrolithiasis.  No right hydronephrosis.  Discussed results with the patient who reports feeling a lot better after the medications.  Pain was controlled today with 2 doses of Toradol.  Discussed results with the patient.  Will send him home with pain medicine, Zofran for nausea, and Flomax.  I have recommended that he calls the kidney stone hotline on his way home today.  Patient reports he is post to travel for work this Sunday and next week to be out of the country, but I have recommended that he tries to see urology first.  Patient reports that he has had a lithotripsy in the past and discussed that he might need 1 this time as well.  Patient is in no acute distress and okay for discharge. Patient is agreeable to plan. All questions answered. Return precautions provided and discharged home at this time.       Problems Addressed:  Kidney stone: acute illness or injury    Amount and/or Complexity of Data Reviewed  Labs: ordered. Decision-making details documented in ED Course.  Radiology: ordered. Decision-making details documented in ED Course.    Risk  Prescription drug management.      Procedures    FINAL IMPRESSION      1. Kidney stone          DISPOSITION/PLAN   DISPOSITION Decision To Discharge 01/07/2023 11:47:27 AM      PATIENT REFERRED TO:  Kidney Stone Hotline  Call [phone](e)  [phone]  Call       [place]  [phone]  Go to   As needed, If symptoms worsen      DISCHARGE MEDICATIONS:  New Prescriptions    ONDANSETRON (ZOFRAN-ODT) 4 MG DISINTEGRATING TABLET    Take 1 tablet by mouth every 8 hours as needed for Nausea or Vomiting    OXYCODONE-ACETAMINOPHEN (PERCOCET) 5-325 MG PER TABLET    Take 1 tablet by mouth every 6 hours as needed  for Pain for up to 3 days. Intended supply: 3 days. Take lowest dose possible to manage pain Max Daily Amount: 4 tablets    TAMSULOSIN  (FLOMAX) 0.4 MG CAPSULE    Take 1 capsule by mouth daily     Controlled Substances Monitoring:          No data to display                (Please note that portions of this note were completed with a voice recognition program.  Efforts were made to edit the dictations but occasionally words are mis-transcribed.)    Suezanne Jacquet, PA-C (electronically signed)  Physician Assistant            Suezanne Jacquet, PA-C  01/07/23 1157

## 2023-01-07 NOTE — ED Notes (Signed)
Discharge paperwork reviewed with pt. Pt requesting more pain meds. Provider aware

## 2023-01-07 NOTE — ED Triage Notes (Signed)
Pt arrives from home with cc of left flank pain, nausea, and urinary discomfort since about 4am. Pt suspects kidney stone as he has hx of them. Last stone 6 years ago.

## 2023-01-07 NOTE — ED Notes (Signed)
Prior to discharge, patient is requesting additional medication because he is unsure when he will be able to go to his pharmacy. Patient reports he is not driving home today and therefore Percocet was given.     [address], PA-C       Suezanne Jacquet, PA-C  01/07/23 [phone]
